# Patient Record
Sex: Male | Born: 1960 | Race: White | Hispanic: Yes | Marital: Married | Smoking: Never smoker
Health system: Southern US, Community
[De-identification: ages and names within clinical notes are randomized; demographics above are authoritative.]

## PROBLEM LIST (undated history)

## (undated) DIAGNOSIS — E119 Type 2 diabetes mellitus without complications: Secondary | ICD-10-CM

## (undated) HISTORY — PX: HAND SURGERY: SHX662

## (undated) HISTORY — PX: NO PAST SURGERIES: SHX2092

## (undated) HISTORY — PX: COLONOSCOPY: SHX174

---

## 2005-10-02 ENCOUNTER — Emergency Department: Payer: Self-pay | Admitting: Emergency Medicine

## 2005-10-02 ENCOUNTER — Other Ambulatory Visit: Payer: Self-pay

## 2006-10-08 ENCOUNTER — Emergency Department: Payer: Self-pay | Admitting: Emergency Medicine

## 2007-11-27 ENCOUNTER — Ambulatory Visit: Payer: Self-pay | Admitting: Ophthalmology

## 2008-02-26 ENCOUNTER — Ambulatory Visit: Payer: Self-pay | Admitting: Nurse Practitioner

## 2008-04-27 ENCOUNTER — Emergency Department: Payer: Self-pay | Admitting: Emergency Medicine

## 2008-04-29 ENCOUNTER — Ambulatory Visit: Payer: Self-pay | Admitting: Emergency Medicine

## 2009-07-11 ENCOUNTER — Emergency Department: Payer: Self-pay | Admitting: Internal Medicine

## 2010-04-28 ENCOUNTER — Ambulatory Visit: Payer: Self-pay | Admitting: Family Medicine

## 2011-07-30 ENCOUNTER — Emergency Department: Payer: Self-pay | Admitting: *Deleted

## 2011-07-30 LAB — PRO B NATRIURETIC PEPTIDE: B-Type Natriuretic Peptide: 20 pg/mL (ref 0–125)

## 2011-07-30 LAB — COMPREHENSIVE METABOLIC PANEL
Albumin: 4 g/dL (ref 3.4–5.0)
Alkaline Phosphatase: 106 U/L (ref 50–136)
Anion Gap: 8 (ref 7–16)
BUN: 11 mg/dL (ref 7–18)
Bilirubin,Total: 0.5 mg/dL (ref 0.2–1.0)
Chloride: 103 mmol/L (ref 98–107)
EGFR (African American): >60
Osmolality: 277 (ref 275–301)
Potassium: 4.1 mmol/L (ref 3.5–5.1)
SGOT(AST): 22 U/L (ref 15–37)
Sodium: 137 mmol/L (ref 136–145)
Total Protein: 7.9 g/dL (ref 6.4–8.2)

## 2011-07-30 LAB — CBC
HCT: 45.3 % (ref 40.0–52.0)
HGB: 15.3 g/dL (ref 13.0–18.0)
MCH: 30.5 pg (ref 26.0–34.0)
MCHC: 33.8 g/dL (ref 32.0–36.0)
MCV: 90 fL (ref 80–100)
Platelet: 222 10*3/uL (ref 150–440)
RDW: 12.9 % (ref 11.5–14.5)

## 2011-07-30 LAB — CK TOTAL AND CKMB (NOT AT ARMC): CK-MB: 0.5 ng/mL (ref 0.5–3.6)

## 2011-10-30 ENCOUNTER — Emergency Department: Payer: Self-pay | Admitting: Emergency Medicine

## 2012-09-15 ENCOUNTER — Emergency Department: Payer: Self-pay | Admitting: Emergency Medicine

## 2012-09-15 LAB — BASIC METABOLIC PANEL
Chloride: 102 mmol/L (ref 98–107)
EGFR (African American): >60
Potassium: 3.9 mmol/L (ref 3.5–5.1)
Sodium: 134 mmol/L — ABNORMAL LOW (ref 136–145)

## 2012-09-15 LAB — CBC
HCT: 40.7 % (ref 40.0–52.0)
HGB: 14.2 g/dL (ref 13.0–18.0)
MCH: 30.8 pg (ref 26.0–34.0)
MCV: 88 fL (ref 80–100)
RBC: 4.61 10*6/uL (ref 4.40–5.90)
RDW: 13 % (ref 11.5–14.5)
WBC: 7.8 10*3/uL (ref 3.8–10.6)

## 2012-09-15 LAB — TROPONIN I: Troponin-I: <0.02 ng/mL

## 2012-10-01 ENCOUNTER — Ambulatory Visit: Payer: Self-pay | Admitting: Gastroenterology

## 2015-02-25 ENCOUNTER — Encounter: Payer: Self-pay | Admitting: Emergency Medicine

## 2015-02-25 ENCOUNTER — Emergency Department
Admission: EM | Admit: 2015-02-25 | Discharge: 2015-02-25 | Disposition: A | Discharge status: Home / Self Care | Payer: Commercial Managed Care - HMO | Attending: Emergency Medicine | Admitting: Emergency Medicine

## 2015-02-25 ENCOUNTER — Emergency Department: Payer: Commercial Managed Care - HMO

## 2015-02-25 DIAGNOSIS — Y9389 Activity, other specified: Secondary | ICD-10-CM | POA: Diagnosis not present

## 2015-02-25 DIAGNOSIS — S29012A Strain of muscle and tendon of back wall of thorax, initial encounter: Secondary | ICD-10-CM | POA: Diagnosis not present

## 2015-02-25 DIAGNOSIS — Y998 Other external cause status: Secondary | ICD-10-CM | POA: Insufficient documentation

## 2015-02-25 DIAGNOSIS — Y9289 Other specified places as the place of occurrence of the external cause: Secondary | ICD-10-CM | POA: Insufficient documentation

## 2015-02-25 DIAGNOSIS — E119 Type 2 diabetes mellitus without complications: Secondary | ICD-10-CM | POA: Diagnosis not present

## 2015-02-25 DIAGNOSIS — Z880 Allergy status to penicillin: Secondary | ICD-10-CM | POA: Diagnosis not present

## 2015-02-25 DIAGNOSIS — X58XXXA Exposure to other specified factors, initial encounter: Secondary | ICD-10-CM | POA: Diagnosis not present

## 2015-02-25 DIAGNOSIS — S299XXA Unspecified injury of thorax, initial encounter: Secondary | ICD-10-CM | POA: Diagnosis present

## 2015-02-25 DIAGNOSIS — S29019A Strain of muscle and tendon of unspecified wall of thorax, initial encounter: Secondary | ICD-10-CM

## 2015-02-25 HISTORY — DX: Type 2 diabetes mellitus without complications: E11.9

## 2015-02-25 MED ORDER — CYCLOBENZAPRINE HCL 5 MG PO TABS: 5.0000 mg | ORAL_TABLET | Freq: Three times a day (TID) | ORAL | Status: DC | PRN

## 2015-02-25 NOTE — Discharge Instructions (Signed)
Distensin torcica Ecologist(Thoracic Strain) Una distensin torcica, a veces llamada distensin dorsal, es una lesin Ameren Corporationen los msculos o los tendones que estn unidos a la parte superior de la espalda, por detrs del pecho. Este tipo de lesin se produce debido a la sobreexigencia o la sobrecarga de un msculo.  Las distensiones torcicas pueden ser leves o graves. Las leves se caracterizan por la distensin de un msculo o un tendn sin que se produzca su rotura. Estas lesiones pueden curarse en el trmino de 1 o 2semanas. Las distensiones ms graves se caracterizan por la rotura de las fibras musculares o de los tendones. Estas causarn ms dolor y pueden tardar de 6 a 8semanas en curarse. CAUSAS Esta afeccin puede ser causada por lo siguiente:  Una lesin en la cual se ejerce una fuerza repentina en un msculo.  La actividad fsica que se realiza sin Dance movement psychotherapistel precalentamiento correspondiente.  El uso excesivo del msculo.  La manera incorrecta de realizar algunos movimientos.  Otras lesiones alrededor de la zona dorsal o que producen tensin en el rea, lo que causa una D.R. Horton, Incsobrecarga en los msculos. En algunos casos, es posible que la causa no se conozca. FACTORES DE RIESGO Esta lesin es ms frecuente en las siguientes personas:  Los deportistas.  Las Eli Lilly and Companypersonas que tienen obesidad. SNTOMAS El sntoma principal de esta afeccin es el dolor, especialmente con el movimiento. Otros sntomas pueden ser los siguientes:  Hematomas.  Hinchazn.  Espasmo. DIAGNSTICO Esta afeccin se puede diagnosticar mediante un examen fsico. Se pueden tomar radiografas para descartar una fractura. TRATAMIENTO El tratamiento de esta afeccin puede incluir lo siguiente:  Reposo y aplicacin de hielo en la zona de la lesin.  Fisioterapia. que incluir ejercicios de elongacin y fortalecimiento.  Analgsicos y antiinflamatorios. INSTRUCCIONES PARA EL CUIDADO EN EL HOGAR  Descanse todo lo que sea  necesario. Siga las indicaciones del mdico respecto de las restricciones en las actividades.  Si se lo indican, aplique hielo sobre la zona lesionada:  Ponga el hielo en una bolsa plstica.  Coloque una toalla entre la piel y la bolsa de hielo.  Coloque el hielo durante 20minutos, 2 a 3veces por Futures traderda.  Tome los medicamentos de venta libre y los recetados solamente como se lo haya indicado el mdico.  Comience a Copyhacer los ejercicios como se lo hayan indicado el mdico o el fisioterapeuta.  Antes de hacer actividad fsica o de practicar deportes, siempre precaliente correctamente.  Flexione las rodillas antes de levantar objetos pesados.  Concurra a todas las visitas de control como se lo haya indicado el mdico. Esto es importante. SOLICITE ATENCIN MDICA SI:  El dolor no se alivia con los United Parcelmedicamentos.  El dolor, los hematomas o la hinchazn estn empeorando.  Tiene fiebre. SOLICITE ATENCIN MDICA DE INMEDIATO SI:  Le falta el aire.  Siente dolor en el pecho.  Siente adormecimiento o debilidad en las piernas.  Tiene prdidas involuntarias de orina (incontinencia urinaria).   Esta informacin no tiene Theme park managercomo fin reemplazar el consejo del mdico. Asegrese de hacerle al mdico cualquier pregunta que tenga.   Document Released: 05/17/2007 Document Revised: 10/29/2014 Elsevier Interactive Patient Education [employer]2016 Elsevier Inc.   Take the previously prescribed antibiotic until complete. Take the muscle relaxant as needed. Follow-up with Dr. Audelia Actonheis for ongoing symptoms.

## 2015-02-25 NOTE — ED Provider Notes (Signed)
[facility] Emergency Department Provider Note ____________________________________________  Time seen: 0945  I have reviewed the triage vital signs and the nursing notes.  HISTORY  Chief Complaint  Back Pain  HPI Mark Moran is a 55 y.o. male presents to the ED for complaints of upper back pain that is worsened at night and aggravated by prolonged sitting. He describes symmetrical pain of the posterior trapezius muscle region into the neck. He denies any recent injury or trauma. He has been out of work for the last 2 weeks secondary to the holiday season. 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 He was recently seen by his primary care provider, and placed on clindamycin for presumed bronchitis. He denies any significant cough, congestion, or shortness of breath. He denies any upper extremity paresthesias or grip changes. He notes pain is increased with range of motion.He describes his discomfort at a 6/10 in triage.  Past Medical History  Diagnosis Date  . Diabetes mellitus without complication (HCC)    There are no active problems to display for this patient.  History reviewed. No pertinent past surgical history.  Current Outpatient Rx  Name  Route  Sig  Dispense  Refill  . cyclobenzaprine (FLEXERIL) 5 MG tablet   Oral   Take 1 tablet (5 mg total) by mouth 3 (three) times daily as needed for muscle spasms.   15 tablet   0    Allergies Penicillins  No family history on file.  Social History Social History  Substance Use Topics  . Smoking status: Never Smoker   . Smokeless tobacco: None  . Alcohol Use: Yes   Review of Systems  Constitutional: Negative for fever. Eyes: Negative for visual changes. ENT: Negative for sore throat. Cardiovascular: Negative for chest pain. Respiratory: Negative for shortness of breath. Gastrointestinal: Negative for abdominal pain, vomiting and  diarrhea. Genitourinary: Negative for dysuria. Musculoskeletal: Positive for upper back pain. Skin: Negative for rash. Neurological: Negative for headaches, focal weakness or numbness. ____________________________________________  PHYSICAL EXAM:  VITAL SIGNS: ED Triage Vitals  Enc Vitals Group     BP 02/25/15 0824 146/75 mmHg     Pulse Rate 02/25/15 0824 79     Resp 02/25/15 0824 20     Temp 02/25/15 0824 98 F (36.7 C)     Temp Source 02/25/15 0824 Oral     SpO2 02/25/15 0824 99 %     Weight 02/25/15 0824 175 lb (79.379 kg)     Height 02/25/15 0824 5\' 6"  (1.676 m)     Head Cir --      Peak Flow --      Pain Score 02/25/15 0825 6     Pain Loc --      Pain Edu? --      Excl. in GC? --     Constitutional: Alert and oriented. Well appearing and in no distress. Head: Normocephalic and atraumatic.      Eyes: Conjunctivae are normal. PERRL. Normal extraocular movements      Ears: Canals clear. TMs intact bilaterally.   Nose: No congestion/rhinorrhea.   Mouth/Throat: Mucous membranes are moist.   Neck: Supple. No thyromegaly. Hematological/Lymphatic/Immunological: No cervical lymphadenopathy. Cardiovascular: Normal rate, regular rhythm.  Respiratory: Normal respiratory effort. No wheezes/rales/rhonchi. Gastrointestinal: Soft and nontender. No distention. Musculoskeletal: Normal spinal alignment without midline tenderness, spasm, deformity, step-off. Patient with normal cervical spine range of motion in all planes. He is without midline tenderness but is noted  to have some tenderness of the bilateral paraspinals across the upper trapezius into the posterior occiput. Normal rotator cuff resistance testing on exam. Nontender with normal range of motion in all extremities.  Neurologic: Cranial nerves II through XII grossly intact. Normal intrinsic and opposition testing. Normal UE DTRs bilaterally. Normal gait without ataxia. Normal speech and language. No gross focal  neurologic deficits are appreciated. Skin:  Skin is warm, dry and intact. No rash noted. Psychiatric: Mood and affect are normal. Patient exhibits appropriate insight and judgment. ____________________________________________   RADIOLOGY CXR IMPRESSION: No active cardiopulmonary disease. ____________________________________________  INITIAL IMPRESSION / ASSESSMENT AND PLAN / ED COURSE  Patient with likely musculoskeletal thoracic pain given a negative chest CT. He is encouraged to continue to finish his previously prescribed clindamycin for bronchitis. He'll be discharged with the perception for Flexeril, and advised to dose over-the-counter ibuprofen for musculoskeletal pain. He'll follow-up with his primary care provider for ongoing symptoms. ____________________________________________  FINAL CLINICAL IMPRESSION(S) / ED DIAGNOSES  Final diagnoses:  Thoracic myofascial strain, initial encounter      Lissa Hoard, PA-C 02/25/15 1204  Minna Antis, MD 02/25/15 1515

## 2015-02-25 NOTE — ED Notes (Addendum)
Pt reports upper back pain is worse at night and when sitting for long periods of time. No currently on any medications for pain.  Back pain for 2 weeks. Has taken ibuprofen several days ago. Denies any injury to back. Also reports pain in chest when pushing on chest.

## 2015-02-25 NOTE — ED Notes (Signed)
Pt presents with upper back pain for two weeks.

## 2016-06-07 DIAGNOSIS — N400 Enlarged prostate without lower urinary tract symptoms: Secondary | ICD-10-CM | POA: Diagnosis not present

## 2016-06-07 DIAGNOSIS — E1129 Type 2 diabetes mellitus with other diabetic kidney complication: Secondary | ICD-10-CM | POA: Diagnosis not present

## 2016-06-07 DIAGNOSIS — R809 Proteinuria, unspecified: Secondary | ICD-10-CM | POA: Diagnosis not present

## 2016-06-07 DIAGNOSIS — Z79899 Other long term (current) drug therapy: Secondary | ICD-10-CM | POA: Diagnosis not present

## 2016-06-07 DIAGNOSIS — E782 Mixed hyperlipidemia: Secondary | ICD-10-CM | POA: Diagnosis not present

## 2016-11-17 DIAGNOSIS — Z23 Encounter for immunization: Secondary | ICD-10-CM | POA: Diagnosis not present

## 2016-11-17 DIAGNOSIS — E1129 Type 2 diabetes mellitus with other diabetic kidney complication: Secondary | ICD-10-CM | POA: Diagnosis not present

## 2016-11-17 DIAGNOSIS — R809 Proteinuria, unspecified: Secondary | ICD-10-CM | POA: Diagnosis not present

## 2016-11-17 DIAGNOSIS — E559 Vitamin D deficiency, unspecified: Secondary | ICD-10-CM | POA: Diagnosis not present

## 2016-11-17 DIAGNOSIS — Z79899 Other long term (current) drug therapy: Secondary | ICD-10-CM | POA: Diagnosis not present

## 2016-11-17 DIAGNOSIS — E782 Mixed hyperlipidemia: Secondary | ICD-10-CM | POA: Diagnosis not present

## 2017-01-17 ENCOUNTER — Encounter: Payer: Self-pay | Admitting: Emergency Medicine

## 2017-01-17 ENCOUNTER — Emergency Department
Admission: EM | Admit: 2017-01-17 | Discharge: 2017-01-17 | Disposition: A | Discharge status: Home / Self Care | Payer: 59 | Attending: Emergency Medicine | Admitting: Emergency Medicine

## 2017-01-17 ENCOUNTER — Emergency Department: Payer: 59

## 2017-01-17 DIAGNOSIS — R0789 Other chest pain: Secondary | ICD-10-CM | POA: Diagnosis not present

## 2017-01-17 DIAGNOSIS — R001 Bradycardia, unspecified: Secondary | ICD-10-CM | POA: Diagnosis not present

## 2017-01-17 DIAGNOSIS — M549 Dorsalgia, unspecified: Secondary | ICD-10-CM | POA: Diagnosis not present

## 2017-01-17 DIAGNOSIS — R079 Chest pain, unspecified: Secondary | ICD-10-CM | POA: Diagnosis not present

## 2017-01-17 LAB — CBC
HEMATOCRIT: 43.5 % (ref 40.0–52.0)
HEMOGLOBIN: 15 g/dL (ref 13.0–18.0)
MCH: 31 pg (ref 26.0–34.0)
MCHC: 34.5 g/dL (ref 32.0–36.0)
MCV: 89.8 fL (ref 80.0–100.0)
Platelets: 208 10*3/uL (ref 150–440)
RBC: 4.84 MIL/uL (ref 4.40–5.90)
RDW: 12.4 % (ref 11.5–14.5)
WBC: 7.5 10*3/uL (ref 3.8–10.6)

## 2017-01-17 LAB — BASIC METABOLIC PANEL
ANION GAP: 9 (ref 5–15)
BUN: 13 mg/dL (ref 6–20)
CALCIUM: 9 mg/dL (ref 8.9–10.3)
CO2: 25 mmol/L (ref 22–32)
Chloride: 100 mmol/L — ABNORMAL LOW (ref 101–111)
Creatinine, Ser: 0.76 mg/dL (ref 0.61–1.24)
Glucose, Bld: 132 mg/dL — ABNORMAL HIGH (ref 65–99)
POTASSIUM: 3.5 mmol/L (ref 3.5–5.1)
Sodium: 134 mmol/L — ABNORMAL LOW (ref 135–145)

## 2017-01-17 LAB — TROPONIN I

## 2017-01-17 MED ORDER — IBUPROFEN 800 MG PO TABS
800.0000 mg | ORAL_TABLET | Freq: Three times a day (TID) | ORAL | 0 refills | Status: DC | PRN
Start: 2017-01-17 — End: 2018-01-21

## 2017-01-17 MED ORDER — KETOROLAC TROMETHAMINE 30 MG/ML IJ SOLN
30.0000 mg | Freq: Once | INTRAMUSCULAR | Status: AC
  Administered 2017-01-17: 30 mg via INTRAMUSCULAR
  Filled 2017-01-17: qty 1

## 2017-01-17 NOTE — ED Triage Notes (Signed)
Pt c/o central intermittent chest pain that started 2 days ago, accompanied by HA and pain that radiates from center of chest into back. Pt denies N/V and SOB.

## 2017-01-17 NOTE — Discharge Instructions (Signed)
Please seek medical attention for any high fevers, chest pain, shortness of breath, change in behavior, persistent vomiting, bloody stool or any other new or concerning symptoms.  

## 2017-01-17 NOTE — ED Provider Notes (Signed)
[facility] Emergency Department Provider Note  ____________________________________________   I have reviewed the triage vital signs and the nursing notes.   HISTORY  Chief Complaint Back pain  History limited by: Not Limited   HPI Mark Moran is a 56 y.o. male who presents to the emergency department today because of back pain.    LOCATION:right back DURATION:roughly 1 month, worse past 2 days TIMING: waxing and waning SEVERITY: severe QUALITY: sharp CONTEXT: patient states that he has had pain for roughly 1 month. Has been worse the past two days. MODIFYING FACTORS: worse with movement. ASSOCIATED SYMPTOMS: has some pain in the front of his right chest. No shortness of breath. No fever.  Per medical record review patient has a history of MSK pain and ER visit in the past.   Past Medical History:  Diagnosis Date  . Diabetes mellitus without complication (HCC)     There are no active problems to display for this patient.   History reviewed. No pertinent surgical history.  Prior to Admission medications   Medication Sig Start Date End Date Taking? Authorizing Provider  cyclobenzaprine (FLEXERIL) 5 MG tablet Take 1 tablet (5 mg total) by mouth 3 (three) times daily as needed for muscle spasms. 02/25/15   Menshew, Charlesetta IvoryJenise V Bacon, PA-C    Allergies Penicillins  History reviewed. No pertinent family history.  Social History Social History   Tobacco Use  . Smoking status: Never Smoker  . Smokeless tobacco: Never Used  Substance Use Topics  . Alcohol use: Yes  . Drug use: Not on file    Review of Systems Constitutional: No fever/chills Eyes: No visual changes. ENT: No sore throat. Cardiovascular: Positive for right sided chest pain. Respiratory: Denies shortness of breath. Gastrointestinal: No abdominal pain.  No nausea, no vomiting.  No diarrhea.   Genitourinary: Negative for dysuria. Musculoskeletal: Positive for right sided  back pain. Skin: Negative for rash. Neurological: Negative for headaches, focal weakness or numbness.  ____________________________________________   PHYSICAL EXAM:  VITAL SIGNS: ED Triage Vitals [01/17/17 1919]  Enc Vitals Group     BP (!) 158/90     Pulse Rate (!) 58     Resp 16     Temp 98 F (36.7 C)     Temp Source Oral     SpO2 99 %     Weight 173 lb (78.5 kg)     Height 5\' 6"  (1.676 m)   Constitutional: Alert and oriented. Well appearing and in no distress. Eyes: Conjunctivae are normal.  ENT   Head: Normocephalic and atraumatic.   Nose: No congestion/rhinnorhea.   Mouth/Throat: Mucous membranes are moist.   Neck: No stridor. Hematological/Lymphatic/Immunilogical: No cervical lymphadenopathy. Cardiovascular: Normal rate, regular rhythm.  No murmurs, rubs, or gallops. Respiratory: Normal respiratory effort without tachypnea nor retractions. Breath sounds are clear and equal bilaterally. No wheezes/rales/rhonchi. Gastrointestinal: Soft and non tender. No rebound. No guarding.  Genitourinary: Deferred Musculoskeletal: Normal range of motion in all extremities. Tender to palpation over the right back. Neurologic:  Normal speech and language. No gross focal neurologic deficits are appreciated.  Skin:  Skin is warm, dry and intact. No rash noted. Psychiatric: Mood and affect are normal. Speech and behavior are normal. Patient exhibits appropriate insight and judgment.  ____________________________________________    LABS (pertinent positives/negatives)  Trop <0.03 CBC wnl BMP na 134, k 3.5, glu 132, cr 0.76  ____________________________________________   EKG  I, Phineas SemenGraydon Ornella Coderre, attending physician, personally viewed and interpreted this EKG  EKG Time: 1919 Rate: 58 Rhythm: sinus bradycardia Axis: normal Intervals: qtc 390 QRS: narrow, q waves v1 ST changes: no st elevation Impression: abnormal  ekg  ____________________________________________    RADIOLOGY  CXR No acute disease  ____________________________________________   PROCEDURES  Procedures  ____________________________________________   INITIAL IMPRESSION / ASSESSMENT AND PLAN / ED COURSE  Pertinent labs & imaging results that were available during my care of the patient were reviewed by me and considered in my medical decision making (see chart for details).  Patient presented to the emergency department today with primary concern for right back pain.  He also did have some right chest pain.  Terms of the back pain differential would include muscle skeletal cause of the pain, referred pain from the lungs from pneumonia, PE.  Dissection, shingles amongst other etiologies.  Exam does show tenderness to the right back.  He did feel improvement after Toradol.  At this point I think muscle skeletal cause of pain most likely.  He does state it is worse with movement.  Will give patient high-dose ibuprofen.  Will give patient primary care follow-up information.  Discussed results and plan with patient.   ____________________________________________   FINAL CLINICAL IMPRESSION(S) / ED DIAGNOSES  Final diagnoses:  Musculoskeletal back pain     Note: This dictation was prepared with Dragon dictation. Any transcriptional errors that result from this process are unintentional     Phineas SemenGoodman, Sundeep Cary, MD 01/17/17 2326

## 2017-01-17 NOTE — ED Notes (Signed)
Pt signed e sginature.  D/c inst to pt and family.  md in with pt during discharge.

## 2017-01-17 NOTE — ED Notes (Signed)
Pt has chest pain for 2 days.  Pt states pain radiates into mid back. Nonsmoker.  Pt has a cough. Denies sob.   No n/v/d.  nsr on monitor. Skin warm and dry,.

## 2017-02-20 DIAGNOSIS — L03031 Cellulitis of right toe: Secondary | ICD-10-CM | POA: Diagnosis not present

## 2017-03-13 DIAGNOSIS — L03031 Cellulitis of right toe: Secondary | ICD-10-CM | POA: Diagnosis not present

## 2017-05-19 DIAGNOSIS — R809 Proteinuria, unspecified: Secondary | ICD-10-CM | POA: Diagnosis not present

## 2017-05-19 DIAGNOSIS — E1169 Type 2 diabetes mellitus with other specified complication: Secondary | ICD-10-CM | POA: Diagnosis not present

## 2017-05-19 DIAGNOSIS — E1129 Type 2 diabetes mellitus with other diabetic kidney complication: Secondary | ICD-10-CM | POA: Diagnosis not present

## 2017-05-19 DIAGNOSIS — Z79899 Other long term (current) drug therapy: Secondary | ICD-10-CM | POA: Diagnosis not present

## 2017-12-12 DIAGNOSIS — Z79899 Other long term (current) drug therapy: Secondary | ICD-10-CM | POA: Diagnosis not present

## 2017-12-12 DIAGNOSIS — E1129 Type 2 diabetes mellitus with other diabetic kidney complication: Secondary | ICD-10-CM | POA: Diagnosis not present

## 2017-12-12 DIAGNOSIS — Z23 Encounter for immunization: Secondary | ICD-10-CM | POA: Diagnosis not present

## 2017-12-12 DIAGNOSIS — E559 Vitamin D deficiency, unspecified: Secondary | ICD-10-CM | POA: Diagnosis not present

## 2017-12-12 DIAGNOSIS — R809 Proteinuria, unspecified: Secondary | ICD-10-CM | POA: Diagnosis not present

## 2018-01-21 ENCOUNTER — Other Ambulatory Visit: Payer: Self-pay

## 2018-01-21 ENCOUNTER — Ambulatory Visit
Admission: EM | Admit: 2018-01-21 | Discharge: 2018-01-21 | Disposition: A | Discharge status: Home / Self Care | Payer: 59 | Attending: Family Medicine | Admitting: Family Medicine

## 2018-01-21 ENCOUNTER — Encounter: Payer: Self-pay | Admitting: Emergency Medicine

## 2018-01-21 DIAGNOSIS — M7918 Myalgia, other site: Secondary | ICD-10-CM

## 2018-01-21 MED ORDER — MELOXICAM 15 MG PO TABS: 15.0000 mg | ORAL_TABLET | Freq: Every day | ORAL | 0 refills | Status: DC | PRN

## 2018-01-21 MED ORDER — METAXALONE 800 MG PO TABS: 800.0000 mg | ORAL_TABLET | Freq: Three times a day (TID) | ORAL | 0 refills | Status: AC | PRN

## 2018-01-21 NOTE — ED Triage Notes (Signed)
Pt c/o thoracic back pain on the right side that come around to the chest when he take a deep breath. Started about a week ago.

## 2018-01-21 NOTE — Discharge Instructions (Signed)
Rest.  Medications as prescribed.  Take care  Dr. Senica Crall  

## 2018-01-21 NOTE — ED Provider Notes (Signed)
[facility]    CSN: [id number] Arrival date & time: 01/21/18  1112  History   Chief Complaint Chief Complaint  Patient presents with  . Back Pain   HPI  57 year old male presents with back, and chest pain.  History of thoracic back pain.  Located on the right side.  Has some pain intermittently of the chest/ribs.  This heavily at work.  This may be the culprit for his symptoms.  No shortness of breath.  His pain is worse when he takes a breath.  No relieving factors.  No reports of fall or trauma.  No other associated symptoms.  No other complaints.   PMH, Surgical Hx, Family Hx, Social History reviewed and updated as below.  Past Medical History:  Diagnosis Date  . Diabetes mellitus without complication [facility](HCC)    Past Surgical History:  Procedure Laterality Date  . NO PAST SURGERIES     Home Medications    Prior to Admission medications   Medication Sig Start Date End Date Taking? Authorizing Provider  aspirin EC 81 MG tablet Take by mouth.   Yes [provider]  Cholecalciferol (VITAMIN D) 50 MCG (2000 UT) tablet Take by mouth.   Yes [provider]  finasteride (PROSCAR) 5 MG tablet TAKE 1 TABLET (5 MG TOTAL) BY MOUTH ONCE DAILY FOR PROSTATE 12/29/17  Yes [provider]  glimepiride (AMARYL) 4 MG tablet TAKE 1 TABLET (4 MG TOTAL) BY MOUTH DAILY WITH BREAKFAST FOR DIABETES 01/10/18  Yes [provider]  metFORMIN (GLUCOPHAGE-XR) 500 MG 24 hr tablet TAKE 4 TABLETS (2,000 MG TOTAL) BY MOUTH DAILY WITH BREAKFAST FOR DIABETES 01/10/18  Yes [provider]  meloxicam (MOBIC) 15 MG tablet Take 1 tablet (15 mg total) by mouth daily as needed. 01/21/18   Tommie Samsook, Lucill Mauck G, DO  metaxalone (SKELAXIN) 800 MG tablet Take 1 tablet (800 mg total) by mouth 3 (three) times daily as needed. 01/21/18   Tommie Samsook, Austan Nicholl G, DO    Family History Family History  Problem Relation Age of Onset  . Diabetes Mother   . Healthy Father     Social  History Social History   Tobacco Use  . Smoking status: Never Smoker  . Smokeless tobacco: Never Used  Substance Use Topics  . Alcohol use: Yes  . Drug use: Never     Allergies   Penicillins   Review of Systems Review of Systems  Constitutional: Negative.   Musculoskeletal:       Back and Chest/rib pain.   Physical Exam Triage Vital Signs ED Triage Vitals  Enc Vitals Group     BP 01/21/18 1136 (!) 149/87     Pulse Rate 01/21/18 1136 61     Resp 01/21/18 1136 18     Temp 01/21/18 1136 98.9 F (37.2 C)     Temp Source 01/21/18 1136 Oral     SpO2 01/21/18 1136 99 %     Weight 01/21/18 1131 173 lb (78.5 kg)     Height 01/21/18 1131 5\' 6"  (1.676 m)     Head Circumference --      Peak Flow --      Pain Score 01/21/18 1131 7     Pain Loc --      Pain Edu? --      Excl. in GC? --    Updated Vital Signs BP (!) 149/87 (BP Location: Left Arm)   Pulse 61   Temp 98.9 F (37.2 C) (Oral)   Resp  18   Ht 5\' 6"  (1.676 m)   Wt 78.5 kg   SpO2 99%   BMI 27.92 kg/m   Visual Acuity Right Eye Distance:   Left Eye Distance:   Bilateral Distance:    Right Eye Near:   Left Eye Near:    Bilateral Near:     Physical Exam  Constitutional: He is oriented to person, place, and time. He appears well-developed. No distress.  HENT:  Head: Normocephalic and atraumatic.  Cardiovascular: Normal rate and regular rhythm.  Pulmonary/Chest: Effort normal and breath sounds normal. He has no wheezes. He has no rales.  Musculoskeletal:  Tenderness of the superior trapezius and periscapular regions (right).  Neurological: He is alert and oriented to person, place, and time.  Psychiatric: He has a normal mood and affect. His behavior is normal.  Nursing note and vitals reviewed.  UC Treatments / Results  Labs (all labs ordered are listed, but only abnormal results are displayed) Labs Reviewed - No data to display  EKG None  Radiology No results found.  Procedures Procedures  (including critical care time)  Medications Ordered in UC Medications - No data to display  Initial Impression / Assessment and Plan / UC Course  I have reviewed the triage vital signs and the nursing notes.  Pertinent labs & imaging results that were available during my care of the patient were reviewed by me and considered in my medical decision making (see chart for details).    57 year old presents with MSK pain.  Likely secondary to heavy lifting.  Treating with meloxicam and Skelaxin.  Final Clinical Impressions(s) / UC Diagnoses   Final diagnoses:  Musculoskeletal pain     Discharge Instructions     Rest.  Medications as prescribed.   Take care  Dr. Adriana Simas    ED Prescriptions    Medication Sig Dispense Auth. Provider   meloxicam (MOBIC) 15 MG tablet Take 1 tablet (15 mg total) by mouth daily as needed. 30 tablet Junette Bernat G, DO   metaxalone (SKELAXIN) 800 MG tablet Take 1 tablet (800 mg total) by mouth 3 (three) times daily as needed. 30 tablet Tommie Sams, DO     Controlled Substance Prescriptions [place] Controlled Substance Registry consulted? Not Applicable   Tommie Sams, DO 01/21/18 1642

## 2018-02-11 ENCOUNTER — Other Ambulatory Visit: Payer: Self-pay | Admitting: Family Medicine

## 2018-04-21 ENCOUNTER — Other Ambulatory Visit: Payer: Self-pay

## 2018-04-21 ENCOUNTER — Emergency Department
Admission: EM | Admit: 2018-04-21 | Discharge: 2018-04-21 | Disposition: A | Discharge status: Home / Self Care | Payer: 59 | Attending: Emergency Medicine | Admitting: Emergency Medicine

## 2018-04-21 ENCOUNTER — Emergency Department: Payer: 59

## 2018-04-21 ENCOUNTER — Encounter: Payer: Self-pay | Admitting: Emergency Medicine

## 2018-04-21 DIAGNOSIS — E119 Type 2 diabetes mellitus without complications: Secondary | ICD-10-CM | POA: Insufficient documentation

## 2018-04-21 DIAGNOSIS — Z7984 Long term (current) use of oral hypoglycemic drugs: Secondary | ICD-10-CM | POA: Diagnosis not present

## 2018-04-21 DIAGNOSIS — R109 Unspecified abdominal pain: Secondary | ICD-10-CM | POA: Diagnosis not present

## 2018-04-21 DIAGNOSIS — Z79899 Other long term (current) drug therapy: Secondary | ICD-10-CM | POA: Insufficient documentation

## 2018-04-21 DIAGNOSIS — R1031 Right lower quadrant pain: Secondary | ICD-10-CM | POA: Insufficient documentation

## 2018-04-21 DIAGNOSIS — Z7982 Long term (current) use of aspirin: Secondary | ICD-10-CM | POA: Insufficient documentation

## 2018-04-21 LAB — URINALYSIS, COMPLETE (UACMP) WITH MICROSCOPIC
BACTERIA UA: NONE SEEN
Bilirubin Urine: NEGATIVE
Glucose, UA: 50 mg/dL — AB
HGB URINE DIPSTICK: NEGATIVE
Ketones, ur: 5 mg/dL — AB
Leukocytes,Ua: NEGATIVE
NITRITE: NEGATIVE
Protein, ur: NEGATIVE mg/dL
SPECIFIC GRAVITY, URINE: 1.015 (ref 1.005–1.030)
Squamous Epithelial / LPF: NONE SEEN (ref 0–5)
pH: 7 (ref 5.0–8.0)

## 2018-04-21 LAB — CBC
HCT: 45.3 % (ref 39.0–52.0)
Hemoglobin: 15.5 g/dL (ref 13.0–17.0)
MCH: 30.7 pg (ref 26.0–34.0)
MCHC: 34.2 g/dL (ref 30.0–36.0)
MCV: 89.7 fL (ref 80.0–100.0)
PLATELETS: 235 10*3/uL (ref 150–400)
RBC: 5.05 MIL/uL (ref 4.22–5.81)
RDW: 11.8 % (ref 11.5–15.5)
WBC: 5.5 10*3/uL (ref 4.0–10.5)
nRBC: 0 % (ref 0.0–0.2)

## 2018-04-21 LAB — COMPREHENSIVE METABOLIC PANEL
ALK PHOS: 86 U/L (ref 38–126)
ALT: 23 U/L (ref 0–44)
AST: 18 U/L (ref 15–41)
Albumin: 4.4 g/dL (ref 3.5–5.0)
Anion gap: 8 (ref 5–15)
BILIRUBIN TOTAL: 1.3 mg/dL — AB (ref 0.3–1.2)
BUN: 11 mg/dL (ref 6–20)
CALCIUM: 9.1 mg/dL (ref 8.9–10.3)
CO2: 27 mmol/L (ref 22–32)
CREATININE: 0.76 mg/dL (ref 0.61–1.24)
Chloride: 100 mmol/L (ref 98–111)
GFR calc Af Amer: >60 mL/min (ref >60)
Glucose, Bld: 155 mg/dL — ABNORMAL HIGH (ref 70–99)
Potassium: 4.2 mmol/L (ref 3.5–5.1)
Sodium: 135 mmol/L (ref 135–145)
TOTAL PROTEIN: 7.8 g/dL (ref 6.5–8.1)

## 2018-04-21 LAB — LIPASE, BLOOD: Lipase: 30 U/L (ref 11–51)

## 2018-04-21 MED ORDER — IOPAMIDOL (ISOVUE-300) INJECTION 61%
30.0000 mL | Freq: Once | INTRAVENOUS | Status: AC | PRN
  Administered 2018-04-21: 30 mL via ORAL

## 2018-04-21 MED ORDER — SODIUM CHLORIDE 0.9% FLUSH
3.0000 mL | Freq: Once | INTRAVENOUS | Status: AC
  Administered 2018-04-21: 3 mL via INTRAVENOUS

## 2018-04-21 MED ORDER — IOHEXOL 300 MG/ML  SOLN
100.0000 mL | Freq: Once | INTRAMUSCULAR | Status: AC | PRN
  Administered 2018-04-21: 100 mL via INTRAVENOUS

## 2018-04-21 NOTE — ED Provider Notes (Signed)
[facility] Emergency Department Provider Note   ____________________________________________   First MD Initiated Contact with Patient 04/21/18 [phone]     (approximate)  I have reviewed the triage vital signs and the nursing notes.   HISTORY  Chief Complaint Flank Pain   HPI Mark Moran is a 58 y.o. male patient complains of right lower quadrant pain for 2 weeks.  Is getting worse.  Is a dull achy pain is worse if he bends over and straightens up completely.  Radiates through to his back.  It is moderately severe.  Patient denies any fever nausea vomiting or diarrhea.   Past Medical History:  Diagnosis Date  . Diabetes mellitus without complication (HCC)     There are no active problems to display for this patient.   Past Surgical History:  Procedure Laterality Date  . NO PAST SURGERIES      Prior to Admission medications   Medication Sig Start Date End Date Taking? Authorizing Provider  aspirin EC 81 MG tablet Take by mouth.    [provider]  Cholecalciferol (VITAMIN D) 50 MCG (2000 UT) tablet Take by mouth.    [provider]  finasteride (PROSCAR) 5 MG tablet TAKE 1 TABLET (5 MG TOTAL) BY MOUTH ONCE DAILY FOR PROSTATE 12/29/17   [provider]  glimepiride (AMARYL) 4 MG tablet TAKE 1 TABLET (4 MG TOTAL) BY MOUTH DAILY WITH BREAKFAST FOR DIABETES 01/10/18   [provider]  meloxicam (MOBIC) 15 MG tablet Take 1 tablet (15 mg total) by mouth daily as needed. 01/21/18   Tommie Samsook, Jayce G, DO  metaxalone (SKELAXIN) 800 MG tablet Take 1 tablet (800 mg total) by mouth 3 (three) times daily as needed. 01/21/18   Tommie Samsook, Jayce G, DO  metFORMIN (GLUCOPHAGE-XR) 500 MG 24 hr tablet TAKE 4 TABLETS (2,000 MG TOTAL) BY MOUTH DAILY WITH BREAKFAST FOR DIABETES 01/10/18   [provider]    Allergies Penicillins  Family History  Problem Relation Age of Onset  . Diabetes Mother   . Healthy Father     Social  History Social History   Tobacco Use  . Smoking status: Never Smoker  . Smokeless tobacco: Never Used  Substance Use Topics  . Alcohol use: Yes  . Drug use: Never    Review of Systems  Constitutional: No fever/chills Eyes: No visual changes. ENT: No sore throat. Cardiovascular: Denies chest pain. Respiratory: Denies shortness of breath. Gastrointestinal:abdominal pain.  No nausea, no vomiting.  No diarrhea.  No constipation. Genitourinary: Negative for dysuria. Musculoskeletal: Negative for back pain. Skin: Negative for rash. Neurological: Negative for headaches, focal weakness ____________________________________________   PHYSICAL EXAM:  VITAL SIGNS: ED Triage Vitals  Enc Vitals Group     BP 04/21/18 0904 (!) 144/87     Pulse Rate 04/21/18 0904 68     Resp 04/21/18 0904 18     Temp 04/21/18 0904 98.4 F (36.9 C)     Temp Source 04/21/18 0904 Oral     SpO2 04/21/18 0904 98 %     Weight 04/21/18 0905 (!) 381 lb 6.3 oz (173 kg)     Height --      Head Circumference --      Peak Flow --      Pain Score 04/21/18 0905 6     Pain Loc --      Pain Edu? --      Excl. in GC? --     Constitutional: Alert and oriented.  Looks mildly uncomfortable. Eyes: Conjunctivae are normal. . Head: Atraumatic. Nose: No congestion/rhinnorhea. Mouth/Throat: Mucous membranes are moist.  Oropharynx non-erythematous. Neck: No stridor.  Cardiovascular: Normal rate, regular rhythm. Grossly normal heart sounds.  Good peripheral circulation. Respiratory: Normal respiratory effort.  No retractions. Lungs CTAB. Gastrointestinal: Soft and nontender. No distention. No abdominal bruits. No CVA tenderness. Musculoskeletal: No lower extremity tenderness nor edema.  Neurologic:  Normal speech and language. No gross focal neurologic deficits are appreciated.  Skin:  Skin is warm, dry and intact. No rash noted.   ____________________________________________   LABS (all labs ordered are listed,  but only abnormal results are displayed)  Labs Reviewed  COMPREHENSIVE METABOLIC PANEL - Abnormal; Notable for the following components:      Result Value   Glucose, Bld 155 (*)    Total Bilirubin 1.3 (*)    All other components within normal limits  URINALYSIS, COMPLETE (UACMP) WITH MICROSCOPIC - Abnormal; Notable for the following components:   Color, Urine YELLOW (*)    APPearance CLEAR (*)    Glucose, UA 50 (*)    Ketones, ur 5 (*)    All other components within normal limits  LIPASE, BLOOD  CBC   ____________________________________________  EKG   ____________________________________________  RADIOLOGY  ED MD interpretation:    Official radiology report(s): Ct Abdomen Pelvis W Contrast  Result Date: 04/21/2018 CLINICAL DATA:  Right-sided flank pain radiating into the groin EXAM: CT ABDOMEN AND PELVIS WITH CONTRAST TECHNIQUE: Multidetector CT imaging of the abdomen and pelvis was performed using the standard protocol following bolus administration of intravenous contrast. CONTRAST:  OMNIPAQUE IOHEXOL 300 MG/ML  SOLN COMPARISON:  None. FINDINGS: Lower chest: No acute abnormality. Hepatobiliary: No focal liver abnormality is seen. No gallstones, gallbladder wall thickening, or biliary dilatation. Pancreas: Unremarkable. No pancreatic ductal dilatation or surrounding inflammatory changes. Spleen: Normal in size without focal abnormality. Adrenals/Urinary Tract: Adrenal glands are within normal limits. Kidneys are well visualized bilaterally. No renal calculi or obstructive changes are noted. Normal excretion is seen on delayed images. The bladder is well distended. Stomach/Bowel: The appendix is within normal limits. The large and small bowel demonstrate no obstructive or inflammatory changes. The stomach is well distended with contrast material. Vascular/Lymphatic: Aortic atherosclerosis. No enlarged abdominal or pelvic lymph nodes. Reproductive: Prostate is unremarkable.  Other: No abdominal wall hernia or abnormality. No abdominopelvic ascites. Musculoskeletal: No acute bony abnormality is noted. IMPRESSION: No acute abnormality noted to correspond with the patient's given clinical symptomatology. Electronically Signed   By: Alcide Clever M.D.   On: 04/21/2018 11:29    ____________________________________________   PROCEDURES  Procedure(s) performed (including Critical Care):  Procedures   ____________________________________________   INITIAL IMPRESSION / ASSESSMENT AND PLAN / ED COURSE Patient with negative CT and lab work.  Pain in his belly is the same even when he tenses his abdominal muscles.  I think the pain is actually muscle strain.  I will let him go home we will try some Motrin see if that helps if not he will come back or if he gets worse or has other symptoms he will come back.  He will also follow-up with his doctor.          ____________________________________________   FINAL CLINICAL IMPRESSION(S) / ED DIAGNOSES  Final diagnoses:  Abdominal pain, unspecified abdominal location     ED Discharge Orders    None       Note:  This document was prepared using Dragon voice recognition software and  may include unintentional dictation errors.    Arnaldo Natal, MD 04/21/18 1153

## 2018-04-21 NOTE — ED Triage Notes (Signed)
Pt to ed with c/o right flank pain that radiates to right lower quad and groin area.

## 2018-04-21 NOTE — Discharge Instructions (Addendum)
I think the pain may be in the muscles of the belly.  Please Motrin 4 of the over-the-counter pills 3 times a day with food.  Do this for 3 or 4 days.  Avoid any heavy lifting for the next week.  Turn for any fever vomiting diarrhea or worse pain.  Come back or see your doctor if you are not well in a week.

## 2018-04-21 NOTE — ED Notes (Signed)
Patient transported to CT 

## 2018-05-14 DIAGNOSIS — R809 Proteinuria, unspecified: Secondary | ICD-10-CM | POA: Diagnosis not present

## 2018-05-14 DIAGNOSIS — Z125 Encounter for screening for malignant neoplasm of prostate: Secondary | ICD-10-CM | POA: Diagnosis not present

## 2018-05-14 DIAGNOSIS — E1129 Type 2 diabetes mellitus with other diabetic kidney complication: Secondary | ICD-10-CM | POA: Diagnosis not present

## 2018-05-14 DIAGNOSIS — R351 Nocturia: Secondary | ICD-10-CM | POA: Diagnosis not present

## 2018-05-14 DIAGNOSIS — E1169 Type 2 diabetes mellitus with other specified complication: Secondary | ICD-10-CM | POA: Diagnosis not present

## 2018-05-14 DIAGNOSIS — N401 Enlarged prostate with lower urinary tract symptoms: Secondary | ICD-10-CM | POA: Diagnosis not present

## 2019-11-20 IMAGING — CT CT ABD-PELV W/ CM
2 of 5 series · 16 of 46 positions shown, 18 images · IV contrast (APPLIED)
Comparison: None.

CLINICAL DATA: Right-sided flank pain radiating into the groin

EXAM:
CT ABDOMEN AND PELVIS WITH CONTRAST
TECHNIQUE: Multidetector CT imaging of the abdomen and pelvis was performed
using the standard protocol following bolus administration of
intravenous contrast.
CONTRAST:  100mL OMNIPAQUE IOHEXOL 300 MG/ML  SOLN

[Series 2: routine abd/pel with · axial · 0.72mm/px · z∈[-398,+17]mm · 13 of 95 slices shown, 15 images]
[im 6/95  soft-tissue]
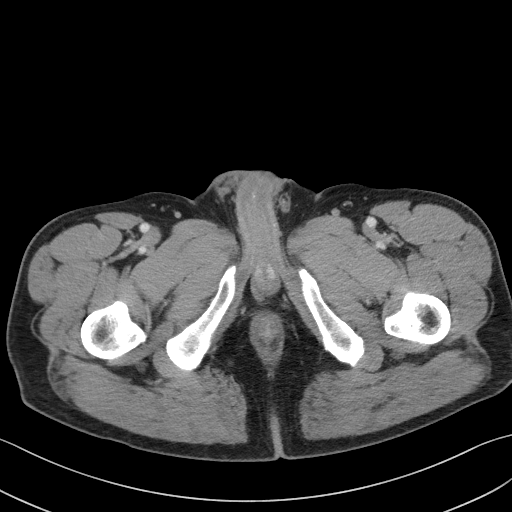
[im 6/95  bone]
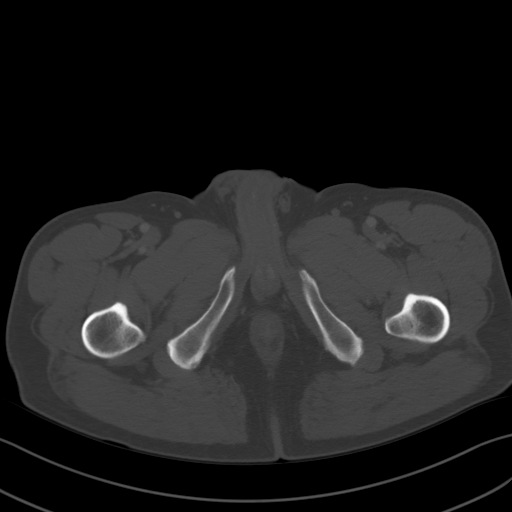
[im 11/95  soft-tissue]
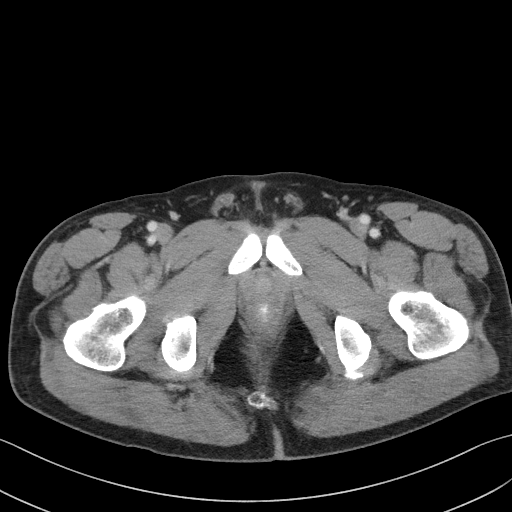
[im 21/95  soft-tissue]
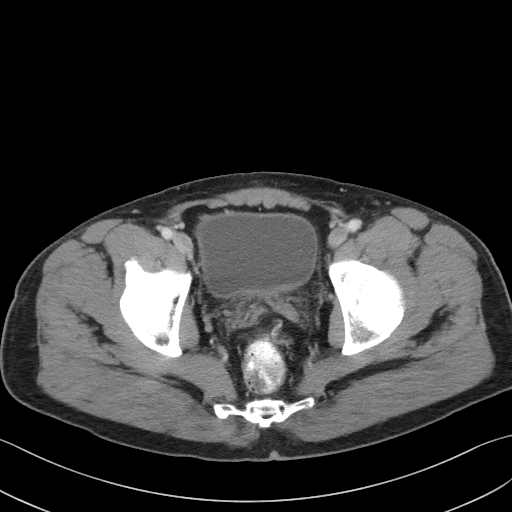
[im 27/95  soft-tissue]
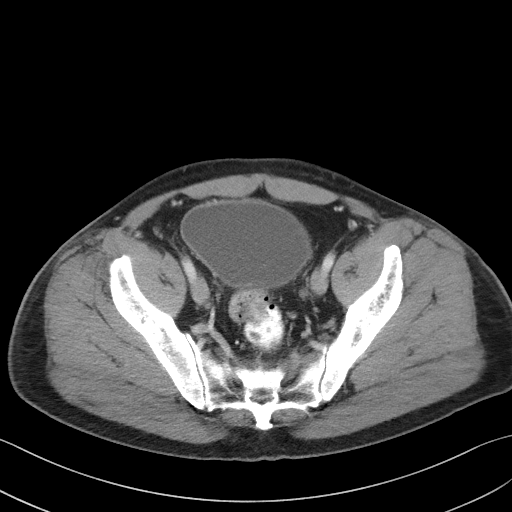
[im 32/95  soft-tissue]
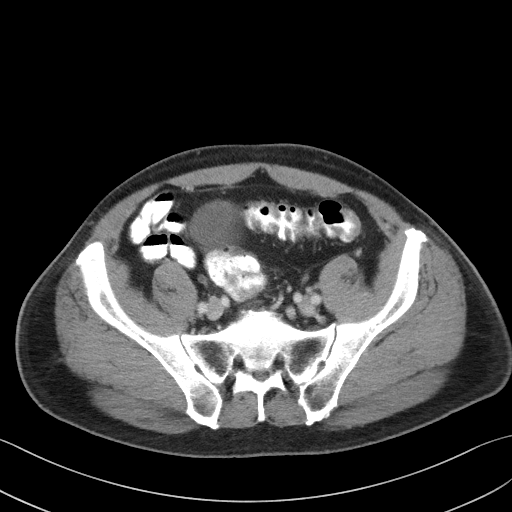
[im 42/95  soft-tissue]
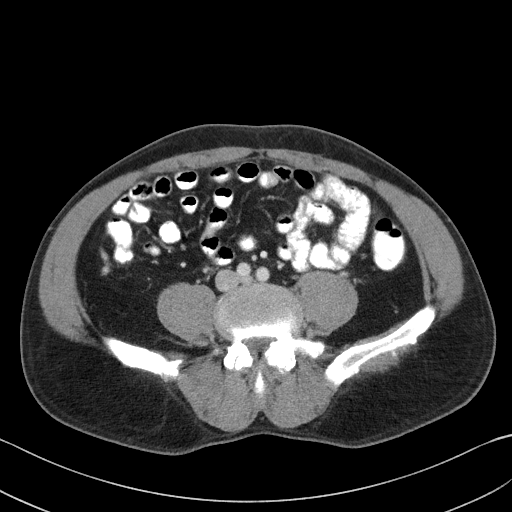
[im 48/95  soft-tissue]
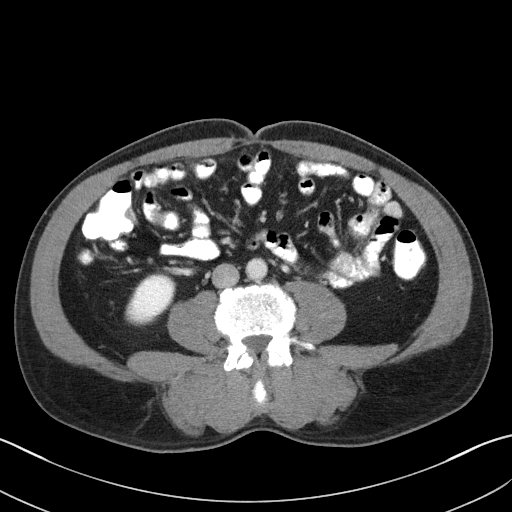
[im 53/95  soft-tissue]
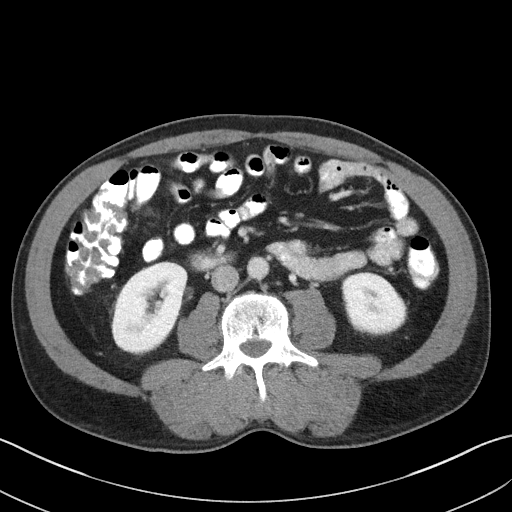
[im 63/95  soft-tissue]
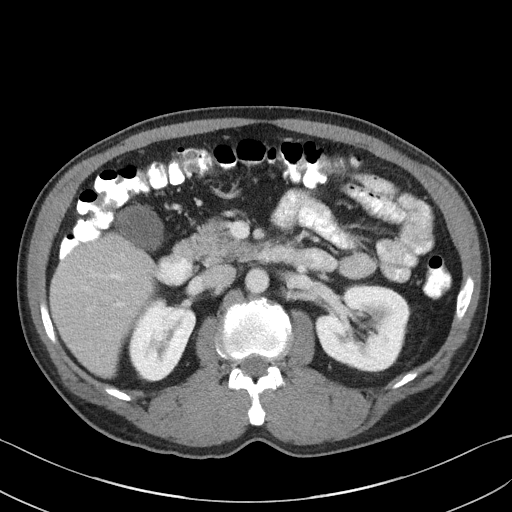
[im 63/95  bone]
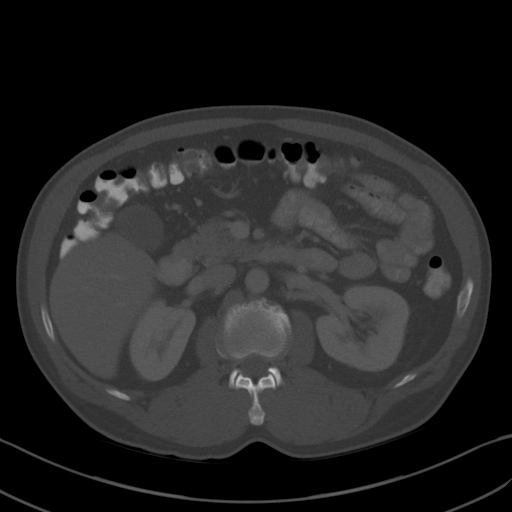
[im 68/95  soft-tissue]
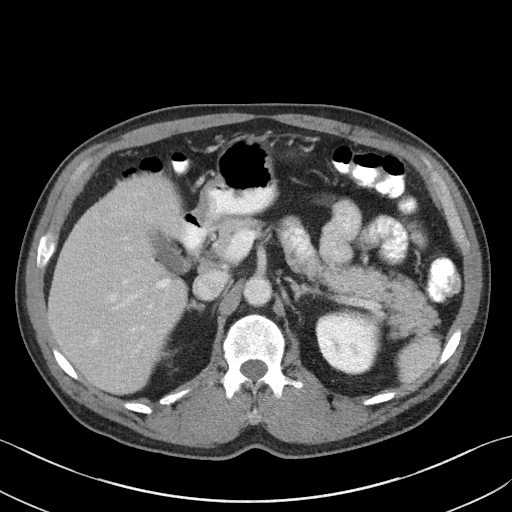
[im 74/95  soft-tissue]
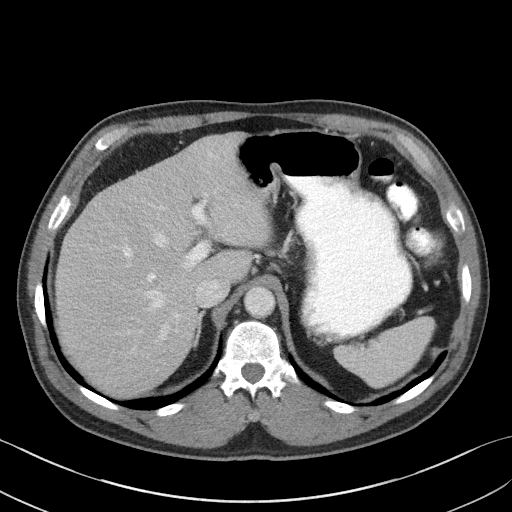
[im 84/95  soft-tissue]
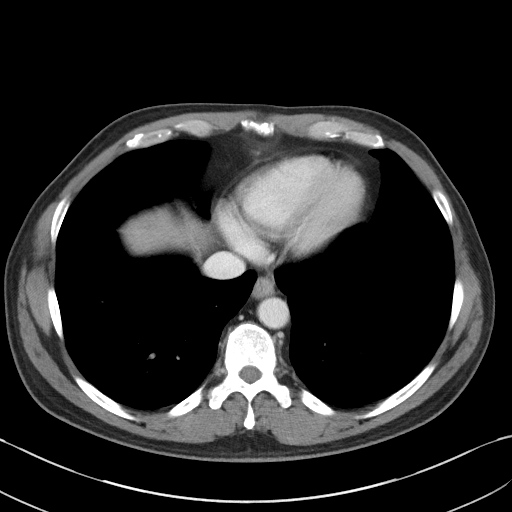
[im 89/95  soft-tissue]
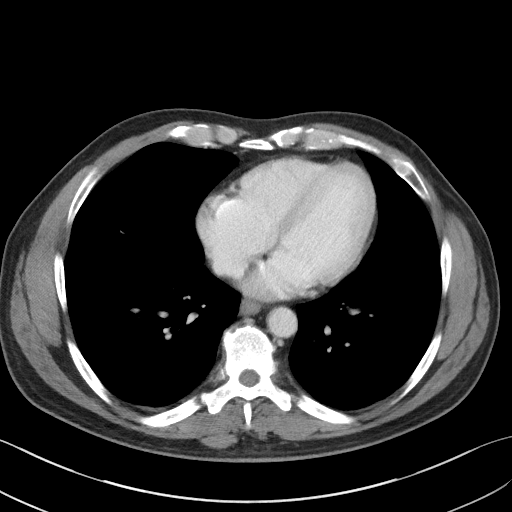

[Series 5: coronal st · coronal · 0.67mm/px · 3 of 89 slices shown]
[im 30/89  soft-tissue]
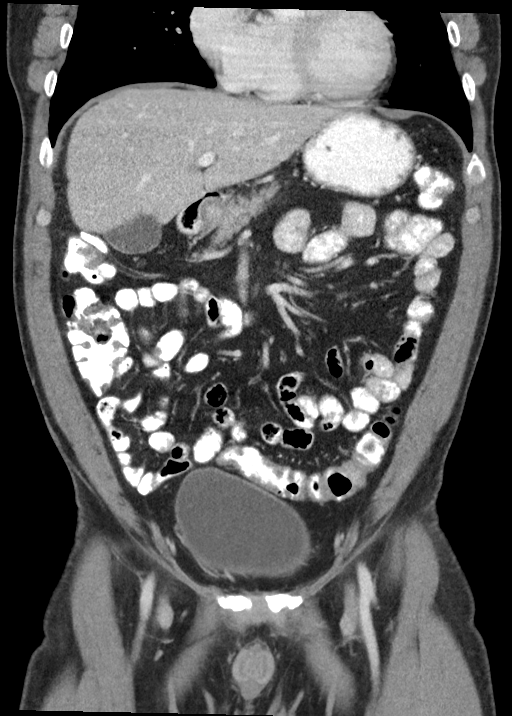
[im 40/89  soft-tissue]
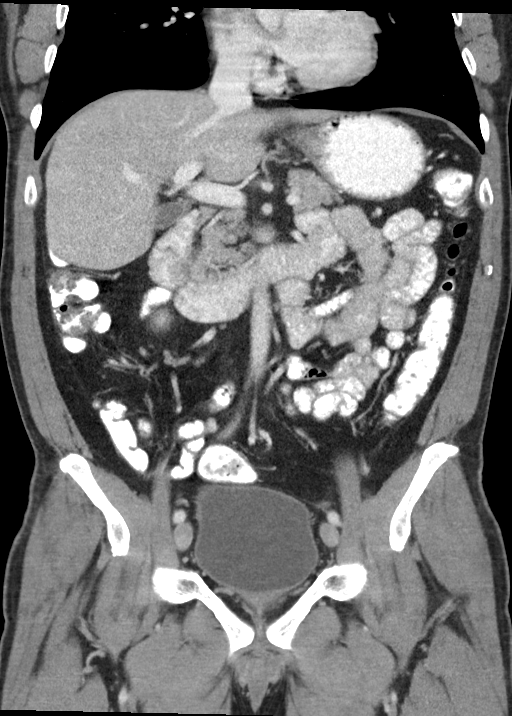
[im 49/89  soft-tissue]
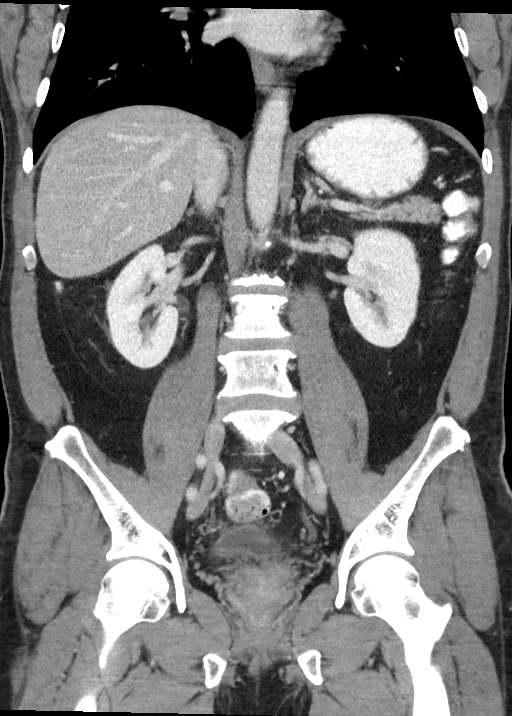

[16 of 46 positions shown; findings below may reference images not displayed]

FINDINGS: Lower chest: No acute abnormality.

Hepatobiliary: No focal liver abnormality is seen. No gallstones,
gallbladder wall thickening, or biliary dilatation.

Pancreas: Unremarkable. No pancreatic ductal dilatation or
surrounding inflammatory changes.

Spleen: Normal in size without focal abnormality.

Adrenals/Urinary Tract: Adrenal glands are within normal limits.
Kidneys are well visualized bilaterally. No renal calculi or
obstructive changes are noted. Normal excretion is seen on delayed
images. The bladder is well distended.

Stomach/Bowel: The appendix is within normal limits. The large and
small bowel demonstrate no obstructive or inflammatory changes. The
stomach is well distended with contrast material.

Vascular/Lymphatic: Aortic atherosclerosis. No enlarged abdominal or
pelvic lymph nodes.

Reproductive: Prostate is unremarkable.

Other: No abdominal wall hernia or abnormality. No abdominopelvic
ascites.

Musculoskeletal: No acute bony abnormality is noted.
IMPRESSION: No acute abnormality noted to correspond with the patient's given
clinical symptomatology.

## 2021-08-29 ENCOUNTER — Ambulatory Visit
Admission: EM | Admit: 2021-08-29 | Discharge: 2021-08-29 | Disposition: A | Discharge status: Home / Self Care | Payer: 59

## 2021-08-29 ENCOUNTER — Encounter: Payer: Self-pay | Admitting: Emergency Medicine

## 2021-08-29 DIAGNOSIS — B029 Zoster without complications: Secondary | ICD-10-CM

## 2021-08-29 MED ORDER — VALACYCLOVIR HCL 1 G PO TABS: 1000.0000 mg | ORAL_TABLET | Freq: Three times a day (TID) | ORAL | 0 refills | Status: AC

## 2021-08-29 MED ORDER — HYDROCODONE-ACETAMINOPHEN 5-325 MG PO TABS: 1.0000 | ORAL_TABLET | Freq: Three times a day (TID) | ORAL | 0 refills | Status: AC | PRN

## 2021-08-29 NOTE — Discharge Instructions (Addendum)
-  Rash is consistent with shingles. - I have sent an antiviral medicine to the pharmacy to hopefully help keep things from getting worse.  I have also sent pain medicine to use if needed.  Can use cool compresses.  Try not to apply anything topically to the rash. - I have given you a work note for the next couple of days. - You are contagious to anybody with chickenpox such as babies, young children.  You should also avoid pregnant women and anyone who is immunocompromised. - This generally heals in 7 to 10 days.  Should be healed when you notice that the rash is crusted over.  This is when you will no longer be contagious.

## 2021-08-29 NOTE — ED Triage Notes (Signed)
Patient c/o itchy burning rash with blisters on his left side of his back and abdomen that started 4 days ago.  Patient denies fevers.

## 2021-08-29 NOTE — ED Provider Notes (Signed)
[facility]    CSN: [id number] Arrival date & time: 08/29/21  3825      History   Chief Complaint Chief Complaint  Patient presents with   Rash    HPI Mark Moran is a 61 y.o. male presenting for approximately 4-day history of pruritic, burning/painful vesicular rash of the left mid back and left abdomen.  He has been applying calamine lotion without relief of symptoms.  He denies any skin irritants or contacts with allergens or poison ivy.  No history of similar rashes.  Patient is diabetic.  HPI  Past Medical History:  Diagnosis Date   Diabetes mellitus without complication (HCC)     There are no problems to display for this patient.   Past Surgical History:  Procedure Laterality Date   NO PAST SURGERIES         Home Medications    Prior to Admission medications   Medication Sig Start Date End Date Taking? Authorizing Provider  aspirin EC 81 MG tablet Take by mouth.   Yes [provider]  atorvastatin (LIPITOR) 40 MG tablet Take 40 mg by mouth daily. 08/26/21  Yes [provider]  Cholecalciferol (VITAMIN D) 50 MCG (2000 UT) tablet Take by mouth.   Yes [provider]  finasteride (PROSCAR) 5 MG tablet TAKE 1 TABLET (5 MG TOTAL) BY MOUTH ONCE DAILY FOR PROSTATE 12/29/17  Yes [provider]  glimepiride (AMARYL) 4 MG tablet TAKE 1 TABLET (4 MG TOTAL) BY MOUTH DAILY WITH BREAKFAST FOR DIABETES 01/10/18  Yes [provider]  HYDROcodone-acetaminophen (NORCO/VICODIN) 5-325 MG tablet Take 1 tablet by mouth every 8 (eight) hours as needed for up to 5 days for severe pain. 08/29/21 09/03/21 Yes Shirlee Latch, PA-C  metFORMIN (GLUCOPHAGE-XR) 500 MG 24 hr tablet TAKE 4 TABLETS (2,000 MG TOTAL) BY MOUTH DAILY WITH BREAKFAST FOR DIABETES 01/10/18  Yes [provider]  OZEMPIC, 1 MG/DOSE, 4 MG/3ML SOPN SMARTSIG:0.75 Milliliter(s) SUB-Q Once a Week 08/25/21  Yes [provider]  valACYclovir (VALTREX) 1000  MG tablet Take 1 tablet (1,000 mg total) by mouth 3 (three) times daily for 7 days. 08/29/21 09/05/21 Yes Shirlee Latch, PA-C  meloxicam (MOBIC) 15 MG tablet Take 1 tablet (15 mg total) by mouth daily as needed. 01/21/18   Tommie Sams, DO  metaxalone (SKELAXIN) 800 MG tablet Take 1 tablet (800 mg total) by mouth 3 (three) times daily as needed. 01/21/18   Tommie Sams, DO    Family History Family History  Problem Relation Age of Onset   Diabetes Mother    Healthy Father     Social History Social History   Tobacco Use   Smoking status: Never   Smokeless tobacco: Never  Vaping Use   Vaping Use: Never used  Substance Use Topics   Alcohol use: Yes   Drug use: Never     Allergies   Penicillins   Review of Systems Review of Systems  Constitutional:  Negative for fatigue and fever.  Respiratory:  Negative for shortness of breath.   Cardiovascular:  Negative for chest pain.  Gastrointestinal:  Negative for nausea and vomiting.  Musculoskeletal:  Negative for arthralgias and joint swelling.  Skin:  Positive for rash.  Neurological:  Negative for weakness and numbness.     Physical Exam Triage Vital Signs ED Triage Vitals  Enc Vitals Group     BP 08/29/21 0940 (!) 151/84     Pulse Rate 08/29/21 0940 68  Resp 08/29/21 0940 15     Temp 08/29/21 0940 98.8 F (37.1 C)     Temp Source 08/29/21 0940 Oral     SpO2 08/29/21 0940 97 %     Weight 08/29/21 0938 176 lb (79.8 kg)     Height 08/29/21 0938 5\' 6"  (1.676 m)     Head Circumference --      Peak Flow --      Pain Score 08/29/21 0938 9     Pain Loc --      Pain Edu? --      Excl. in GC? --    No data found.  Updated Vital Signs BP (!) 151/84 (BP Location: Right Arm)   Pulse 68   Temp 98.8 F (37.1 C) (Oral)   Resp 15   Ht 5\' 6"  (1.676 m)   Wt 176 lb (79.8 kg)   SpO2 97%   BMI 28.41 kg/m       Physical Exam Vitals and nursing note reviewed.  Constitutional:      General: He is not in acute  distress.    Appearance: Normal appearance. He is well-developed. He is not ill-appearing.  HENT:     Head: Normocephalic and atraumatic.  Eyes:     General: No scleral icterus.    Conjunctiva/sclera: Conjunctivae normal.  Cardiovascular:     Rate and Rhythm: Normal rate and regular rhythm.     Heart sounds: Normal heart sounds.  Pulmonary:     Effort: Pulmonary effort is normal. No respiratory distress.     Breath sounds: Normal breath sounds.  Abdominal:     Palpations: Abdomen is soft.     Tenderness: There is no abdominal tenderness.  Musculoskeletal:     Cervical back: Neck supple.  Skin:    General: Skin is warm and dry.     Capillary Refill: Capillary refill takes less than 2 seconds.     Findings: Rash (patch of vesicles and erythema/swelling left mid back and left upper abdomen. very TTP) present.  Neurological:     General: No focal deficit present.     Mental Status: He is alert. Mental status is at baseline.     Motor: No weakness.  Psychiatric:        Mood and Affect: Mood normal.        Behavior: Behavior normal.      UC Treatments / Results  Labs (all labs ordered are listed, but only abnormal results are displayed) Labs Reviewed - No data to display  EKG   Radiology No results found.  Procedures Procedures (including critical care time)  Medications Ordered in UC Medications - No data to display  Initial Impression / Assessment and Plan / UC Course  I have reviewed the triage vital signs and the nursing notes.  Pertinent labs & imaging results that were available during my care of the patient were reviewed by me and considered in my medical decision making (see chart for details).  61 year old male presenting for painful, pruritic vesicular rash of left back and left abdomen for the past 4 days.  On examination, the rash is most consistent with herpes zoster/shingles.  Discussed this with him.  We will treat with Valtrex but I advised him to not  sure if it will be particularly helpful since he is out of the 48 to 72-hour window where it is most helpful.  Reviewed cool compresses to the area.  Reviewed antihistamines for pruritus.  Sent Norco for severe pain.  Reviewed controlled substance database.  Patient low risk for abuse.  Reviewed supportive care.  Discussed with him who he is contagious to and when his rash should be healed.  Reviewed return precautions and provided him with a work note.   Final Clinical Impressions(s) / UC Diagnoses   Final diagnoses:  Herpes zoster without complication     Discharge Instructions      -Rash is consistent with shingles. - I have sent an antiviral medicine to the pharmacy to hopefully help keep things from getting worse.  I have also sent pain medicine to use if needed.  Can use cool compresses.  Try not to apply anything topically to the rash. - I have given you a work note for the next couple of days. - You are contagious to anybody with chickenpox such as babies, young children.  You should also avoid pregnant women and anyone who is immunocompromised. - This generally heals in 7 to 10 days.  Should be healed when you notice that the rash is crusted over.  This is when you will no longer be contagious.     ED Prescriptions     Medication Sig Dispense Auth. Provider   valACYclovir (VALTREX) 1000 MG tablet Take 1 tablet (1,000 mg total) by mouth 3 (three) times daily for 7 days. 21 tablet Laurene Footman B, PA-C   HYDROcodone-acetaminophen (NORCO/VICODIN) 5-325 MG tablet Take 1 tablet by mouth every 8 (eight) hours as needed for up to 5 days for severe pain. 15 tablet Danton Clap, PA-C      I have reviewed the PDMP during this encounter.   Danton Clap, PA-C 08/29/21 1013

## 2022-03-21 ENCOUNTER — Other Ambulatory Visit: Payer: Self-pay | Admitting: Podiatry

## 2022-03-21 ENCOUNTER — Ambulatory Visit
Admission: RE | Admit: 2022-03-21 | Discharge: 2022-03-21 | Disposition: A | Discharge status: Home / Self Care | Payer: 59 | Source: Ambulatory Visit | Attending: Podiatry | Admitting: Podiatry

## 2022-03-21 DIAGNOSIS — M86171 Other acute osteomyelitis, right ankle and foot: Secondary | ICD-10-CM | POA: Insufficient documentation

## 2022-03-25 ENCOUNTER — Other Ambulatory Visit: Payer: Self-pay | Admitting: Podiatry

## 2022-03-25 ENCOUNTER — Encounter: Payer: Self-pay | Admitting: Podiatry

## 2022-03-30 NOTE — Discharge Instructions (Signed)
[facility]  POST OPERATIVE INSTRUCTIONS FOR DR. FOWLER AND [facility] PODIATRY [facility]PART[facility]   Take your medication as prescribed.  Pain medication should be taken only as needed.  Keep the dressing clean, dry and intact.  Keep your foot elevated above the heart level for the first 48 hours.  Walking to the bathroom and brief periods of walking are acceptable, unless we have instructed you to be non-weight bearing.  Always wear your post-op shoe when walking.  Always use your crutches if you are to be non-weight bearing.  Do not take a shower. Baths are permissible as long as the foot is kept out of the water.   Every hour you are awake:  Bend your knee 15 times. Flex foot 15 times Massage calf 15 times  Call [facility] ([phone]) if any of the following problems occur: You develop a temperature or fever. The bandage becomes saturated with blood. Medication does not stop your pain. Injury of the foot occurs. Any symptoms of infection including redness, odor, or red streaks running from wound. 

## 2022-03-31 ENCOUNTER — Other Ambulatory Visit: Payer: Self-pay

## 2022-03-31 ENCOUNTER — Ambulatory Visit: Payer: 59 | Admitting: Anesthesiology

## 2022-03-31 ENCOUNTER — Encounter: Payer: Self-pay | Admitting: Podiatry

## 2022-03-31 ENCOUNTER — Ambulatory Visit
Admission: RE | Admit: 2022-03-31 | Discharge: 2022-03-31 | Disposition: A | Discharge status: Home / Self Care | Payer: 59 | Attending: Podiatry | Admitting: Podiatry

## 2022-03-31 ENCOUNTER — Encounter
Admission: RE | Disposition: A | Discharge status: Home / Self Care | Payer: Self-pay | Source: Home / Self Care | Attending: Podiatry

## 2022-03-31 DIAGNOSIS — L03031 Cellulitis of right toe: Secondary | ICD-10-CM | POA: Insufficient documentation

## 2022-03-31 DIAGNOSIS — E11621 Type 2 diabetes mellitus with foot ulcer: Secondary | ICD-10-CM | POA: Diagnosis present

## 2022-03-31 DIAGNOSIS — M869 Osteomyelitis, unspecified: Secondary | ICD-10-CM | POA: Insufficient documentation

## 2022-03-31 DIAGNOSIS — M86171 Other acute osteomyelitis, right ankle and foot: Secondary | ICD-10-CM

## 2022-03-31 DIAGNOSIS — E1142 Type 2 diabetes mellitus with diabetic polyneuropathy: Secondary | ICD-10-CM | POA: Insufficient documentation

## 2022-03-31 HISTORY — PX: AMPUTATION TOE: SHX6595

## 2022-03-31 LAB — GLUCOSE, CAPILLARY: Glucose-Capillary: 111 mg/dL — ABNORMAL HIGH (ref 70–99)

## 2022-03-31 SURGERY — AMPUTATION, TOE
Anesthesia: General | Site: Toe | Laterality: Right

## 2022-03-31 MED ORDER — 0.9 % SODIUM CHLORIDE (POUR BTL) OPTIME
TOPICAL | Status: DC | PRN
  Administered 2022-03-31: 60 mL

## 2022-03-31 MED ORDER — FENTANYL CITRATE PF 50 MCG/ML IJ SOSY: 25.0000 ug | PREFILLED_SYRINGE | INTRAMUSCULAR | Status: DC | PRN

## 2022-03-31 MED ORDER — BUPIVACAINE HCL 0.5 % IJ SOLN
INTRAMUSCULAR | Status: DC | PRN
  Administered 2022-03-31: 10 mL

## 2022-03-31 MED ORDER — PROPOFOL 500 MG/50ML IV EMUL
INTRAVENOUS | Status: DC | PRN
  Administered 2022-03-31: 150 ug/kg/min via INTRAVENOUS

## 2022-03-31 MED ORDER — HYDROCODONE-ACETAMINOPHEN 5-325 MG PO TABS: 1.0000 | ORAL_TABLET | Freq: Four times a day (QID) | ORAL | 0 refills | Status: AC | PRN

## 2022-03-31 MED ORDER — DOXYCYCLINE MONOHYDRATE 100 MG PO TABS: 100.0000 mg | ORAL_TABLET | Freq: Two times a day (BID) | ORAL | 0 refills | Status: AC

## 2022-03-31 MED ORDER — CEFAZOLIN SODIUM-DEXTROSE 2-4 GM/100ML-% IV SOLN
2.0000 g | INTRAVENOUS | Status: AC
  Administered 2022-03-31: 2 g via INTRAVENOUS

## 2022-03-31 MED ORDER — MIDAZOLAM HCL 2 MG/2ML IJ SOLN
INTRAMUSCULAR | Status: DC | PRN
  Administered 2022-03-31: 2 mg via INTRAVENOUS

## 2022-03-31 MED ORDER — FENTANYL CITRATE (PF) 100 MCG/2ML IJ SOLN
INTRAMUSCULAR | Status: DC | PRN
  Administered 2022-03-31 (×2): 50 ug via INTRAVENOUS

## 2022-03-31 MED ORDER — LACTATED RINGERS IV SOLN: INTRAVENOUS | Status: DC

## 2022-03-31 MED ORDER — ASPIRIN EC 81 MG PO TBEC: 81.0000 mg | DELAYED_RELEASE_TABLET | Freq: Every day | ORAL | 11 refills | Status: AC

## 2022-03-31 SURGICAL SUPPLY — 27 items
BNDG CMPR 75X21 PLY HI ABS (MISCELLANEOUS) ×1
BNDG CMPR STD VLCR NS LF 5.8X4 (GAUZE/BANDAGES/DRESSINGS) ×1
BNDG ELASTIC 4X5.8 VLCR NS LF (GAUZE/BANDAGES/DRESSINGS) IMPLANT
BNDG ESMARCH 4 X 12 STRL LF (GAUZE/BANDAGES/DRESSINGS) ×1
BNDG ESMARCH 4X12 STRL LF (GAUZE/BANDAGES/DRESSINGS) IMPLANT
BNDG GAUZE DERMACEA FLUFF 4 (GAUZE/BANDAGES/DRESSINGS) ×1 IMPLANT
BNDG GZE DERMACEA 4 6PLY (GAUZE/BANDAGES/DRESSINGS) ×1
CANISTER SUCT 1200ML W/VALVE (MISCELLANEOUS) ×1 IMPLANT
DURAPREP 26ML APPLICATOR (WOUND CARE) ×1 IMPLANT
ELECT REM PT RETURN 9FT ADLT (ELECTROSURGICAL) ×1
ELECTRODE REM PT RTRN 9FT ADLT (ELECTROSURGICAL) ×1 IMPLANT
GAUZE SPONGE 4X4 12PLY STRL (GAUZE/BANDAGES/DRESSINGS) ×1 IMPLANT
GAUZE STRETCH 2X75IN STRL (MISCELLANEOUS) ×1 IMPLANT
GAUZE XEROFORM 1X8 LF (GAUZE/BANDAGES/DRESSINGS) ×1 IMPLANT
GLOVE BIOGEL PI IND STRL 7.5 (GLOVE) ×1 IMPLANT
GLOVE SURG SS PI 7.0 STRL IVOR (GLOVE) ×1 IMPLANT
GOWN STRL REUS W/ TWL LRG LVL3 (GOWN DISPOSABLE) ×2 IMPLANT
GOWN STRL REUS W/TWL LRG LVL3 (GOWN DISPOSABLE) ×3
KIT TURNOVER KIT A (KITS) ×1 IMPLANT
NDL HYPO 25GX1X1/2 BEV (NEEDLE) ×1 IMPLANT
NEEDLE HYPO 25GX1X1/2 BEV (NEEDLE) ×1 IMPLANT
NS IRRIG 500ML POUR BTL (IV SOLUTION) ×1 IMPLANT
PACK EXTREMITY (MISCELLANEOUS) ×1 IMPLANT
STOCKINETTE STRL 6IN 960660 (GAUZE/BANDAGES/DRESSINGS) ×1 IMPLANT
SUT ETHILON 3-0 FS-10 30 BLK (SUTURE)
SUTURE EHLN 3-0 FS-10 30 BLK (SUTURE) IMPLANT
SYR 10ML LL (SYRINGE) ×1 IMPLANT

## 2022-03-31 NOTE — H&P (Signed)
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  The various methods of treatment have been discussed with the patient.  No guarantees were given.  After consideration of risks, benefits and other options for treatment, the patient has consented to surgery.  I have reviewed the patients' chart and labs.    PROCEDURE: RIGHT 3RD TOE PARTIAL AMPUTATION  A history and physical examination was performed in my office.  The patient was reexamined.  There have been no changes to this history and physical examination.  Caroline More, DPM

## 2022-03-31 NOTE — Transfer of Care (Signed)
Immediate Anesthesia Transfer of Care Note  Patient: Mark Moran  Procedure(s) Performed: TOE INTERPHALANGEAL JOINT - THIRD (Right: Toe)  Patient Location: PACU  Anesthesia Type: General  Level of Consciousness: awake, alert  and patient cooperative  Airway and Oxygen Therapy: Patient Spontanous Breathing and Patient connected to supplemental oxygen  Post-op Assessment: Post-op Vital signs reviewed, Patient's Cardiovascular Status Stable, Respiratory Function Stable, Patent Airway and No signs of Nausea or vomiting  Post-op Vital Signs: Reviewed and stable  Complications: No notable events documented.

## 2022-03-31 NOTE — Anesthesia Preprocedure Evaluation (Signed)
Anesthesia Evaluation  Patient identified by MRN, date of birth, ID band Patient awake    Reviewed: Allergy & Precautions, H&P , NPO status , Patient's Chart, lab work & pertinent test results, reviewed documented beta blocker date and time   History of Anesthesia Complications Negative for: history of anesthetic complications  Airway Mallampati: II  TM Distance: >3 FB Neck ROM: full    Dental  (+) Dental Advidsory Given, Missing, Teeth Intact   Pulmonary neg pulmonary ROS   Pulmonary exam normal breath sounds clear to auscultation       Cardiovascular Exercise Tolerance: Good negative cardio ROS Normal cardiovascular exam Rhythm:regular Rate:Normal     Neuro/Psych negative neurological ROS  negative psych ROS   GI/Hepatic negative GI ROS, Neg liver ROS,,,  Endo/Other  diabetes    Renal/GU negative Renal ROS  negative genitourinary   Musculoskeletal   Abdominal   Peds  Hematology negative hematology ROS (+)   Anesthesia Other Findings Past Medical History: No date: Diabetes mellitus without complication (HCC)   Reproductive/Obstetrics negative OB ROS                             Anesthesia Physical Anesthesia Plan  ASA: 2  Anesthesia Plan: General   Post-op Pain Management:    Induction: Intravenous  PONV Risk Score and Plan: 2 and Propofol infusion and TIVA  Airway Management Planned: Natural Airway and Simple Face Mask  Additional Equipment:   Intra-op Plan:   Post-operative Plan:   Informed Consent: I have reviewed the patients History and Physical, chart, labs and discussed the procedure including the risks, benefits and alternatives for the proposed anesthesia with the patient or authorized representative who has indicated his/her understanding and acceptance.     Dental Advisory Given  Plan Discussed with: Anesthesiologist, CRNA and Surgeon  Anesthesia Plan  Comments:        Anesthesia Quick Evaluation

## 2022-03-31 NOTE — Anesthesia Postprocedure Evaluation (Signed)
Anesthesia Post Note  Patient: Leisure centre manager  Procedure(s) Performed: TOE INTERPHALANGEAL JOINT AMPUTATION RIGHT FOOT (Right: Toe)  Patient location during evaluation: PACU Anesthesia Type: General Level of consciousness: awake and alert Pain management: pain level controlled Vital Signs Assessment: post-procedure vital signs reviewed and stable Respiratory status: spontaneous breathing, nonlabored ventilation, respiratory function stable and patient connected to nasal cannula oxygen Cardiovascular status: blood pressure returned to baseline and stable Postop Assessment: no apparent nausea or vomiting Anesthetic complications: no   No notable events documented.   Last Vitals:  Vitals:   03/31/22 0830 03/31/22 0845  BP: 115/79 124/84  Pulse: 76 68  Resp: 14 12  Temp:  (!) 36.2 C  SpO2: 98% 98%    Last Pain:  Vitals:   03/31/22 0830  PainSc: 0-No pain                 Martha Clan

## 2022-03-31 NOTE — Op Note (Signed)
PODIATRY / FOOT AND ANKLE SURGERY OPERATIVE REPORT    SURGEON: Caroline More, DPM  PRE-OPERATIVE DIAGNOSIS:  1.  Right third toe osteomyelitis with associated diabetic foot ulceration 2.  Cellulitis right third toe 3.  Diabetes type 2 polyneuropathy  POST-OPERATIVE DIAGNOSIS: Same  PROCEDURE(S): Right third toe partial amputation  HEMOSTASIS: Right ankle tourniquet  ANESTHESIA: MAC  ESTIMATED BLOOD LOSS: 5 cc  FINDING(S): 1.  Osteomyelitis distal phalanx right third toe with no extension past the DIPJ  PATHOLOGY/SPECIMEN(S): Right third toe partial amputation with proximal margin purple ink path specimen, bone culture right third toe  INDICATIONS:   Mark Moran is a 62 y.o. male who presents with a nonhealing ulceration to the right third toe.  Patient underwent a partial nail avulsion/I&D due to infection present to the area with Dr. Vickki Muff in the summertime.  Patient subsequently had a nonhealing wound to the area.  Further debridement was performed to the area but patient subsequently ended up with an infection to the area again and nonhealing wound.  Patient had an x-ray taken which was suspicious for osteomyelitis and appeared to have bone exposed.  A MRI was taken which showed osteomyelitis to the distal phalanx of the right third toe.  Patient has been placed on antibiotics prior to surgery.  All treatment options were discussed with the patient both conservative and surgical attempts at correction include potential risks and complications at this time patient is elected for surgical intervention consisting of right third toe partial amputation.  No guarantees given.  Consent obtained prior to procedure..  DESCRIPTION: After obtaining full informed written consent, the patient was brought back to the operating room and placed supine upon the operating table.  The patient received IV antibiotics prior to induction.  After obtaining adequate anesthesia, 10 cc of half percent  Marcaine plain was injected about the right third toe in a digital block.  The patient was prepped and draped in the standard fashion.  An Esmarch bandage was used to exsanguinate the right lower extremity and the pneumatic ankle tourniquet was inflated.  Attention was directed to the right third toe at the distal tip at the DIPJ where a fishmouth type of incision was made leaving a larger plantar flap and dorsal flap.  The incision was made straight to bone.  At this time the DIPJ was identified and an extensor tenotomy and capsulotomy was performed followed by release the collateral ligaments.  At this time the distal phalanx was disarticulated and passed off the operative site.  A bone culture was taken from the distal phalanx and the proximal margin was marked in purple ink and sent off for pathologic specimen.  The middle phalanx head appeared to be healthy and appeared to have cartilage intact with no evidence of osteomyelitis clinically, the bone also appeared to be hard, no purulence expressible.  The surgical site was flushed with copious amounts normal sterile saline.  The skin was then reapproximated well coapted with 3-0 nylon.  The pneumatic ankle tourniquet was deflated and a prompt hyperemic response was noted to all digits of the right foot.  A postoperative dressing was applied consisting of Xeroform followed by 4 x 4 gauze, gauze roll, Ace wrap.  A postoperative shoe was to be placed on patient in the postoperative area.  The patient tolerated the procedure and anesthesia well was transferred to recovery room vital signs stable vascular status intact all toes the right foot.  Following a period of postoperative monitoring the patient be  discharged with the appropriate orders, instructions, and medications.  Patient is to follow-up in clinic within 1 week of surgical date.  Believe that all infection was removed with partial toe amputation today.  COMPLICATIONS: None  CONDITION: Good,  stable  Caroline More, DPM

## 2022-04-01 ENCOUNTER — Encounter: Payer: Self-pay | Admitting: Podiatry

## 2022-04-04 LAB — SURGICAL PATHOLOGY

## 2022-04-05 LAB — AEROBIC/ANAEROBIC CULTURE W GRAM STAIN (SURGICAL/DEEP WOUND)
Culture: NO GROWTH
Gram Stain: NONE SEEN

## 2022-11-23 ENCOUNTER — Encounter: Payer: Self-pay | Admitting: Emergency Medicine

## 2022-11-23 ENCOUNTER — Emergency Department: Payer: 59

## 2022-11-23 ENCOUNTER — Emergency Department
Admission: EM | Admit: 2022-11-23 | Discharge: 2022-11-23 | Disposition: A | Discharge status: Home / Self Care | Payer: 59 | Attending: Emergency Medicine | Admitting: Emergency Medicine

## 2022-11-23 ENCOUNTER — Other Ambulatory Visit: Payer: Self-pay

## 2022-11-23 DIAGNOSIS — M25531 Pain in right wrist: Secondary | ICD-10-CM | POA: Diagnosis not present

## 2022-11-23 DIAGNOSIS — M79641 Pain in right hand: Secondary | ICD-10-CM | POA: Insufficient documentation

## 2022-11-23 DIAGNOSIS — E119 Type 2 diabetes mellitus without complications: Secondary | ICD-10-CM | POA: Insufficient documentation

## 2022-11-23 MED ORDER — ACETAMINOPHEN 500 MG PO TABS
1000.0000 mg | ORAL_TABLET | Freq: Once | ORAL | Status: AC
  Administered 2022-11-23: 1000 mg via ORAL
  Filled 2022-11-23: qty 2

## 2022-11-23 MED ORDER — KETOROLAC TROMETHAMINE 30 MG/ML IJ SOLN
30.0000 mg | Freq: Once | INTRAMUSCULAR | Status: AC
  Administered 2022-11-23: 30 mg via INTRAMUSCULAR
  Filled 2022-11-23: qty 1

## 2022-11-23 NOTE — Discharge Instructions (Addendum)
Take acetaminophen 650 mg and ibuprofen 400 mg every 6 hours for pain.  Take with food. Keep the hand in the splint.   Thank you for choosing [place] for your health care today!  Please see your primary doctor this week for a follow up appointment.   If you have any new, worsening, or unexpected symptoms call your doctor right away or come back to the emergency department for reevaluation.  It was my pleasure to care for you today.   Daneil Dan Modesto Charon, MD

## 2022-11-23 NOTE — ED Triage Notes (Signed)
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

## 2022-11-23 NOTE — ED Provider Notes (Signed)
[facility] Provider Note    Event Date/Time   First MD Initiated Contact with Patient 11/23/22 [phone]     (approximate)   History   Hand Pain   HPI  Mark Moran is a 62 y.o. male   Past medical history of diabetes who presents to the emergency department with right wrist pain.  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  Over the last 3 days has had wrist and distal forearm volar aspect soreness worsening.  No swelling, warmth, erythema or fever or chills.  Independent Historian contributed to assessment above: Wife at the bedside corroborates information given above as well as past medical history    Physical Exam   Triage Vital Signs: ED Triage Vitals [11/23/22 0416]  Encounter Vitals Group     BP      Systolic BP Percentile      Diastolic BP Percentile      Pulse      Resp      Temp      Temp src      SpO2      Weight 166 lb (75.3 kg)     Height 5\' 6"  (1.676 m)     Head Circumference      Peak Flow      Pain Score 10     Pain Loc      Pain Education      Exclude from Growth Chart     Most recent vital signs: Vitals:   11/23/22 0419  BP: 131/75  Pulse: 67  Resp: 16  Temp: 98.5 F (36.9 C)  SpO2: 98%    General: Awake, no distress.  CV:  Good peripheral perfusion.  Resp:  Normal effort.  Abd:  No distention.  Other:  Sensation intact to the affected right upper extremity, and radial pulse strong.  Brisk cap refill.  No erythema warmth or swelling to the affected wrist.  He does have tenderness to palpation in the central area of the volar aspect of the wrist, with soft compartments to the forearm, no significant tenderness to palpation of the bony prominences or musculature of the forearm.  With flexion extension he reproduces pain.   ED Results / Procedures / Treatments   Labs (all labs ordered are  listed, but only abnormal results are displayed) Labs Reviewed - No data to display     RADIOLOGY I independently reviewed and interpreted x-ray of the forearm and see no obvious fractures or dislocations I also reviewed radiologist's formal read.   PROCEDURES:  Critical Care performed: No  Procedures   MEDICATIONS ORDERED IN ED: Medications  acetaminophen (TYLENOL) tablet 1,000 mg (has no administration in time range)  ketorolac (TORADOL) 30 MG/ML injection 30 mg (has no administration in time range)    IMPRESSION / MDM / ASSESSMENT AND PLAN / ED COURSE  I reviewed the triage vital signs and the nursing notes.                                Patient's presentation is most consistent with acute presentation with potential threat to life or bodily function.  Differential diagnosis includes, but is not limited to, tendinitis, carpal tunnel syndrome, considered but less likely septic joint, fractures or dislocations   The patient is on the cardiac monitor to evaluate for evidence  of arrhythmia and/or significant heart rate changes.  MDM:    Repetitive use injury to the forearm/right wrist, most likely tendinitis, inflammation, sprain, or carpal tunnel syndrome.  Considered fractures or dislocations though doubt given no direct trauma or falls, x-rays negative, and considered septic joint or compartment syndrome however clinical exam is not consistent with these diagnoses.  Will put in wrist splint, give nonsteroidal anti-inflammatories, and have him follow-up with PMD.       FINAL CLINICAL IMPRESSION(S) / ED DIAGNOSES   Final diagnoses:  Wrist pain, right     Rx / DC Orders   ED Discharge Orders     None        Note:  This document was prepared using Dragon voice recognition software and may include unintentional dictation errors.    Pilar Jarvis, MD 11/23/22 [phone]

## 2022-11-23 NOTE — ED Notes (Signed)
ED Provider at bedside.
